# Patient Record
Sex: Female | Born: 1985
Health system: Southern US, Community
[De-identification: ages and names within clinical notes are randomized; demographics above are authoritative.]

## PROBLEM LIST (undated history)

## (undated) HISTORY — DX: Morbid (severe) obesity due to excess calories: E66.01

---

## 2007-03-09 ENCOUNTER — Emergency Department: Payer: Self-pay | Admitting: Emergency Medicine

## 2008-11-14 ENCOUNTER — Emergency Department: Payer: Self-pay | Admitting: Emergency Medicine

## 2011-01-14 ENCOUNTER — Emergency Department: Payer: Self-pay | Admitting: Internal Medicine

## 2011-04-06 ENCOUNTER — Emergency Department: Payer: Self-pay | Admitting: Unknown Physician Specialty

## 2011-08-24 ENCOUNTER — Emergency Department: Payer: Self-pay | Admitting: Emergency Medicine

## 2020-06-18 ENCOUNTER — Ambulatory Visit
Admission: EM | Admit: 2020-06-18 | Discharge: 2020-06-18 | Disposition: A | Discharge status: Home / Self Care | Payer: BC Managed Care – PPO | Attending: Physician Assistant | Admitting: Physician Assistant

## 2020-06-18 ENCOUNTER — Encounter: Payer: Self-pay | Admitting: Emergency Medicine

## 2020-06-18 ENCOUNTER — Ambulatory Visit (INDEPENDENT_AMBULATORY_CARE_PROVIDER_SITE_OTHER): Payer: BC Managed Care – PPO

## 2020-06-18 ENCOUNTER — Other Ambulatory Visit: Payer: Self-pay

## 2020-06-18 DIAGNOSIS — R0602 Shortness of breath: Secondary | ICD-10-CM | POA: Diagnosis not present

## 2020-06-18 DIAGNOSIS — R059 Cough, unspecified: Secondary | ICD-10-CM

## 2020-06-18 DIAGNOSIS — U071 COVID-19: Secondary | ICD-10-CM | POA: Diagnosis not present

## 2020-06-18 DIAGNOSIS — R5383 Other fatigue: Secondary | ICD-10-CM | POA: Diagnosis not present

## 2020-06-18 DIAGNOSIS — R509 Fever, unspecified: Secondary | ICD-10-CM

## 2020-06-18 LAB — RESP PANEL BY RT-PCR (FLU A&B, COVID) ARPGX2
Influenza A by PCR: NEGATIVE
Influenza B by PCR: NEGATIVE
SARS Coronavirus 2 by RT PCR: POSITIVE — AB

## 2020-06-18 MED ORDER — ALBUTEROL SULFATE HFA 108 (90 BASE) MCG/ACT IN AERS
1.0000 | INHALATION_SPRAY | Freq: Four times a day (QID) | RESPIRATORY_TRACT | 0 refills | Status: AC | PRN
Start: 2020-06-18 — End: 2020-06-25

## 2020-06-18 MED ORDER — PSEUDOEPH-BROMPHEN-DM 30-2-10 MG/5ML PO SYRP: 10.0000 mL | ORAL_SOLUTION | Freq: Four times a day (QID) | ORAL | 0 refills | Status: AC | PRN

## 2020-06-18 MED ORDER — IBUPROFEN 800 MG PO TABS
800.0000 mg | ORAL_TABLET | Freq: Once | ORAL | Status: AC
  Administered 2020-06-18: 800 mg via ORAL

## 2020-06-18 MED ORDER — IBUPROFEN 800 MG PO TABS: 800.0000 mg | ORAL_TABLET | Freq: Three times a day (TID) | ORAL | 0 refills | Status: AC

## 2020-06-18 NOTE — Discharge Instructions (Addendum)
Your Covid test is positive.  You need to be isolated at least another 4 days.  If symptomatic, go home and rest. Push fluids. Take Tylenol as needed for discomfort. Gargle warm salt water. Throat lozenges. Take Mucinex DM or Robitussin for cough. Humidifier in bedroom to ease coughing. Warm showers. Also review the COVID handout for more information.  COVID-19 INFECTION: The incubation period of COVID-19 is approximately 14 days after exposure, with most symptoms developing in roughly 4-5 days. Symptoms may range in severity from mild to critically severe. Roughly 80% of those infected will have mild symptoms. People of any age may become infected with COVID-19 and have the ability to transmit the virus. The most common symptoms include: fever, fatigue, cough, body aches, headaches, sore throat, nasal congestion, shortness of breath, nausea, vomiting, diarrhea, changes in smell and/or taste.    COURSE OF ILLNESS Some patients may begin with mild disease which can progress quickly into critical symptoms. If your symptoms are worsening please call ahead to the Emergency Department and proceed there for further treatment. Recovery time appears to be roughly 1-2 weeks for mild symptoms and 3-6 weeks for severe disease.   GO IMMEDIATELY TO ER FOR FEVER YOU ARE UNABLE TO GET DOWN WITH TYLENOL, BREATHING PROBLEMS, CHEST PAIN, FATIGUE, LETHARGY, INABILITY TO EAT OR DRINK, ETC  QUARANTINE AND ISOLATION: To help decrease the spread of COVID-19 please remain isolated if you have COVID infection or are highly suspected to have COVID infection. This means -stay home and isolate to one room in the home if you live with others. Do not share a bed or bathroom with others while ill, sanitize and wipe down all countertops and keep common areas clean and disinfected. You may discontinue isolation if you have a mild case and are asymptomatic 10 days after symptom onset as long as you have been fever free >24 hours without  having to take Motrin or Tylenol. If your case is more severe (meaning you develop pneumonia or are admitted in the hospital), you may have to isolate longer.   If you have been in close contact (within 6 feet) of someone diagnosed with COVID 19, you are advised to quarantine in your home for 14 days as symptoms can develop anywhere from 2-14 days after exposure to the virus. If you develop symptoms, you  must isolate.  Most current guidelines for COVID after exposure -isolate 10 days if you ARE NOT tested for COVID as long as symptoms do not develop -isolate 7 days if you are tested and remain asymptomatic -You do not necessarily need to be tested for COVID if you have + exposure and        develop   symptoms. Just isolate at home x10 days from symptom onset During this global pandemic, CDC advises to practice social distancing, try to stay at least 54ft away from others at all times. Wear a face covering. Wash and sanitize your hands regularly and avoid going anywhere that is not necessary.  KEEP IN MIND THAT THE COVID TEST IS NOT 100% ACCURATE AND YOU SHOULD STILL DO EVERYTHING TO PREVENT POTENTIAL SPREAD OF VIRUS TO OTHERS (WEAR MASK, WEAR GLOVES, WASH HANDS AND SANITIZE REGULARLY). IF INITIAL TEST IS NEGATIVE, THIS MAY NOT MEAN YOU ARE DEFINITELY NEGATIVE. MOST ACCURATE TESTING IS DONE 5-7 DAYS AFTER EXPOSURE.   It is not advised by CDC to get re-tested after receiving a positive COVID test since you can still test positive for weeks to months after you  have already cleared the virus.   *If you have not been vaccinated for COVID, I strongly suggest you consider getting vaccinated as long as there are no contraindications.

## 2020-06-18 NOTE — ED Triage Notes (Signed)
Pt c/o cough, shortness of breath and fatigue. Started about 6 days ago. She has not had covid vaccines.

## 2020-06-18 NOTE — ED Provider Notes (Signed)
[facility]    CSN: [id number] Arrival date & time: 06/18/20  1635      History   Chief Complaint Chief Complaint  Patient presents with  . Cough    HPI Laura Schroeder is a 34 y.o. female presenting for 6-day history of cough, shortness of breath, and fatigue.  She says she has felt feverish but has not recorded temperature.  She says that her cough is productive of yellow mucus.  Patient admits to feeling short of breath when she walks or if she talks too much.  She also admits to body aches.  She denies any chest pain, abdominal pain, nausea/vomiting or diarrhea.  Patient denies any known COVID-19 or influenza exposure, but says that her boyfriend has been sick with a cough and fever.  She has not had COVID-19 vaccine.  Patient denies any history of cardiopulmonary disease.  She says she has taken over-the-counter Alka-Seltzer for symptoms.  Patient denies any chronic medical problems and does not take any routine medications.  Patient denies any other complaints or concerns.  HPI  History reviewed. No pertinent past medical history.  There are no problems to display for this patient.   History reviewed. No pertinent surgical history.  OB History   No obstetric history on file.      Home Medications    Prior to Admission medications   Medication Sig Start Date End Date Taking? Authorizing Provider  albuterol (VENTOLIN HFA) 108 (90 Base) MCG/ACT inhaler Inhale 1-2 puffs into the lungs every 6 (six) hours as needed for up to 7 days for wheezing or shortness of breath. 06/18/20 06/25/20  Eusebio Friendly B, PA-C  brompheniramine-pseudoephedrine-DM 30-2-10 MG/5ML syrup Take 10 mLs by mouth 4 (four) times daily as needed for up to 7 days. 06/18/20 06/25/20  Eusebio Friendly B, PA-C  ibuprofen (ADVIL) 800 MG tablet Take 1 tablet (800 mg total) by mouth 3 (three) times daily for 7 days. 06/18/20 06/25/20  Shirlee Latch, PA-C    Family History No family history on file.   Social History Social History   Tobacco Use  . Smoking status: Never Smoker  . Smokeless tobacco: Never Used  Vaping Use  . Vaping Use: Never used  Substance Use Topics  . Alcohol use: Not Currently  . Drug use: Not Currently     Allergies   Patient has no known allergies.   Review of Systems Review of Systems  Constitutional: Positive for fatigue. Negative for chills, diaphoresis and fever.  HENT: Positive for congestion, rhinorrhea and sore throat. Negative for ear pain, sinus pressure and sinus pain.   Respiratory: Positive for cough and shortness of breath.   Gastrointestinal: Negative for abdominal pain, nausea and vomiting.  Musculoskeletal: Negative for arthralgias and myalgias.  Skin: Negative for rash.  Neurological: Negative for weakness and headaches.  Hematological: Negative for adenopathy.     Physical Exam Triage Vital Signs ED Triage Vitals  Enc Vitals Group     BP 06/18/20 1715 120/76     Pulse Rate 06/18/20 1715 (!) 124     Resp 06/18/20 1715 20     Temp 06/18/20 1715 100.1 F (37.8 C)     Temp Source 06/18/20 1715 Oral     SpO2 06/18/20 1715 98 %     Weight 06/18/20 1712 235 lb (106.6 kg)     Height 06/18/20 1712 5\' 8"  (1.727 m)     Head Circumference --      Peak Flow --  Pain Score 06/18/20 1712 0     Pain Loc --      Pain Edu? --      Excl. in GC? --    No data found.  Updated Vital Signs BP 120/76 (BP Location: Right Arm)   Pulse (!) 124   Temp 100.1 F (37.8 C) (Oral)   Resp 20   Ht 5\' 8"  (1.727 m)   Wt 235 lb (106.6 kg)   LMP 06/14/2020   SpO2 98%   BMI 35.73 kg/m       Physical Exam Vitals and nursing note reviewed.  Constitutional:      General: She is not in acute distress.    Appearance: Normal appearance. She is ill-appearing. She is not toxic-appearing.  HENT:     Head: Normocephalic and atraumatic.     Nose: Congestion and rhinorrhea present.     Mouth/Throat:     Mouth: Mucous membranes are moist.      Pharynx: Oropharynx is clear.  Eyes:     General: No scleral icterus.       Right eye: No discharge.        Left eye: No discharge.     Conjunctiva/sclera: Conjunctivae normal.  Cardiovascular:     Rate and Rhythm: Regular rhythm. Tachycardia present.     Heart sounds: Normal heart sounds.  Pulmonary:     Effort: Pulmonary effort is normal. No respiratory distress.     Breath sounds: Normal breath sounds. No wheezing, rhonchi or rales.     Comments: Patient seems to have difficulty taking a deep breath. Musculoskeletal:     Cervical back: Neck supple.  Skin:    General: Skin is dry.  Neurological:     General: No focal deficit present.     Mental Status: She is alert. Mental status is at baseline.     Motor: No weakness.     Gait: Gait normal.  Psychiatric:        Mood and Affect: Mood normal.        Behavior: Behavior normal.        Thought Content: Thought content normal.      UC Treatments / Results  Labs (all labs ordered are listed, but only abnormal results are displayed) Labs Reviewed  RESP PANEL BY RT-PCR (FLU A&B, COVID) ARPGX2 - Abnormal; Notable for the following components:      Result Value   SARS Coronavirus 2 by RT PCR POSITIVE (*)    All other components within normal limits    EKG   Radiology DG Chest 2 View  Result Date: 06/18/2020 CLINICAL DATA:  Shortness of breath with cough and fever. Symptoms for 6 days. EXAM: CHEST - 2 VIEW COMPARISON:  None. FINDINGS: There are low lung volumes with bibasilar pulmonary opacities which are favored to reflect atelectasis. There is no pleural effusion or pneumothorax. The heart size and mediastinal contours are normal. The bones appear unremarkable. IMPRESSION: Low lung volumes with probable bibasilar atelectasis. Given the patient's symptoms, early pneumonia cannot be excluded. Electronically Signed   By: 14/12/2019 M.D.   On: 06/18/2020 18:19    Procedures Procedures (including critical care time)   Medications Ordered in UC Medications  ibuprofen (ADVIL) tablet 800 mg (800 mg Oral Given 06/18/20 1739)    Initial Impression / Assessment and Plan / UC Course  I have reviewed the triage vital signs and the nursing notes.  Pertinent labs & imaging results that were available during my care of  the patient were reviewed by me and considered in my medical decision making (see chart for details).   34 year old female presenting for 6-day history of cough, congestion, fatigue and body aches.  In clinic temperature is 100.1 F and pulse is 124 bpm.  Other vital signs are all stable.  She is ill-appearing but nontoxic.  She does have nasal congestion and rhinorrhea on exam.  Chest is clear to auscultation, but she appears to have difficulty taking deep breath.  Heart regular rhythm.  Respiratory panel obtained today to assess for possible flu and Covid.  Patient given 800 mg ibuprofen for body aches.  Chest x-ray ordered at this time for cough, fever, tachycardia and complaint of shortness of breath.  Chest x-ray obtained to assess for possible pneumonia.  Chest x-ray shows atelectasis and low lung volumes but no definitive pneumonia.  I have independently reviewed images. Discussed results with patient.  She did test positive for COVID-19 on the respiratory swab.  Vital signs are stable so advised supportive care at home.  I sent Bromfed cough syrup, ibuprofen, and an inhaler.  CDC guidelines, isolation protocol and ED precautions reviewed with patient.  Contacted infusion center and asked them to give patient a call if she meets criteria for MAB therapy.  Final Clinical Impressions(s) / UC Diagnoses   Final diagnoses:  COVID-19  Cough  Fatigue, unspecified type  Shortness of breath     Discharge Instructions     Your Covid test is positive.  You need to be isolated at least another 4 days.  If symptomatic, go home and rest. Push fluids. Take Tylenol as needed for discomfort. Gargle  warm salt water. Throat lozenges. Take Mucinex DM or Robitussin for cough. Humidifier in bedroom to ease coughing. Warm showers. Also review the COVID handout for more information.  COVID-19 INFECTION: The incubation period of COVID-19 is approximately 14 days after exposure, with most symptoms developing in roughly 4-5 days. Symptoms may range in severity from mild to critically severe. Roughly 80% of those infected will have mild symptoms. People of any age may become infected with COVID-19 and have the ability to transmit the virus. The most common symptoms include: fever, fatigue, cough, body aches, headaches, sore throat, nasal congestion, shortness of breath, nausea, vomiting, diarrhea, changes in smell and/or taste.    COURSE OF ILLNESS Some patients may begin with mild disease which can progress quickly into critical symptoms. If your symptoms are worsening please call ahead to the Emergency Department and proceed there for further treatment. Recovery time appears to be roughly 1-2 weeks for mild symptoms and 3-6 weeks for severe disease.   GO IMMEDIATELY TO ER FOR FEVER YOU ARE UNABLE TO GET DOWN WITH TYLENOL, BREATHING PROBLEMS, CHEST PAIN, FATIGUE, LETHARGY, INABILITY TO EAT OR DRINK, ETC  QUARANTINE AND ISOLATION: To help decrease the spread of COVID-19 please remain isolated if you have COVID infection or are highly suspected to have COVID infection. This means -stay home and isolate to one room in the home if you live with others. Do not share a bed or bathroom with others while ill, sanitize and wipe down all countertops and keep common areas clean and disinfected. You may discontinue isolation if you have a mild case and are asymptomatic 10 days after symptom onset as long as you have been fever free >24 hours without having to take Motrin or Tylenol. If your case is more severe (meaning you develop pneumonia or are admitted in the hospital), you may  have to isolate longer.   If you have  been in close contact (within 6 feet) of someone diagnosed with COVID 19, you are advised to quarantine in your home for 14 days as symptoms can develop anywhere from 2-14 days after exposure to the virus. If you develop symptoms, you  must isolate.  Most current guidelines for COVID after exposure -isolate 10 days if you ARE NOT tested for COVID as long as symptoms do not develop -isolate 7 days if you are tested and remain asymptomatic -You do not necessarily need to be tested for COVID if you have + exposure and        develop   symptoms. Just isolate at home x10 days from symptom onset During this global pandemic, CDC advises to practice social distancing, try to stay at least 606ft away from others at all times. Wear a face covering. Wash and sanitize your hands regularly and avoid going anywhere that is not necessary.  KEEP IN MIND THAT THE COVID TEST IS NOT 100% ACCURATE AND YOU SHOULD STILL DO EVERYTHING TO PREVENT POTENTIAL SPREAD OF VIRUS TO OTHERS (WEAR MASK, WEAR GLOVES, WASH HANDS AND SANITIZE REGULARLY). IF INITIAL TEST IS NEGATIVE, THIS MAY NOT MEAN YOU ARE DEFINITELY NEGATIVE. MOST ACCURATE TESTING IS DONE 5-7 DAYS AFTER EXPOSURE.   It is not advised by CDC to get re-tested after receiving a positive COVID test since you can still test positive for weeks to months after you have already cleared the virus.   *If you have not been vaccinated for COVID, I strongly suggest you consider getting vaccinated as long as there are no contraindications.      ED Prescriptions    Medication Sig Dispense Auth. Provider   brompheniramine-pseudoephedrine-DM 30-2-10 MG/5ML syrup Take 10 mLs by mouth 4 (four) times daily as needed for up to 7 days. 150 mL Eusebio FriendlyEaves, Mackensie Pilson B, PA-C   ibuprofen (ADVIL) 800 MG tablet Take 1 tablet (800 mg total) by mouth 3 (three) times daily for 7 days. 21 tablet Eusebio FriendlyEaves, Dilan Novosad B, PA-C   albuterol (VENTOLIN HFA) 108 (90 Base) MCG/ACT inhaler Inhale 1-2 puffs into the lungs  every 6 (six) hours as needed for up to 7 days for wheezing or shortness of breath. 1 g Shirlee LatchEaves, Meghana Tullo B, PA-C     PDMP not reviewed this encounter.   Shirlee Latchaves, Yitzel Shasteen B, PA-C 06/18/20 1830

## 2020-06-19 ENCOUNTER — Telehealth: Payer: Self-pay | Admitting: Physician Assistant

## 2020-06-19 ENCOUNTER — Encounter: Payer: Self-pay | Admitting: Physician Assistant

## 2020-06-19 NOTE — Telephone Encounter (Signed)
Called to discuss with patient about Covid symptoms and the use of sotrovimab, bamlanivimab/etesevimab or casirivimab/imdevimab, a monoclonal antibody infusion for those with mild to moderate Covid symptoms and at a high risk of hospitalization.  Pt is qualified for this infusion at the [facility] due to; Specific high risk criteria : BMI > 25 and Other high risk medical condition per CDC:  high SVI  Sx onset 11/30 based on UC notes.   Message left to call back our hotline [phone]. My chart message sent if active on Mychart.   Cline Crock PA-C

## 2020-07-09 ENCOUNTER — Other Ambulatory Visit: Payer: Self-pay

## 2020-07-09 ENCOUNTER — Encounter: Payer: Self-pay | Admitting: Physician Assistant

## 2020-07-09 DIAGNOSIS — Z20822 Contact with and (suspected) exposure to covid-19: Secondary | ICD-10-CM

## 2020-07-11 LAB — NOVEL CORONAVIRUS, NAA: SARS-CoV-2, NAA: NOT DETECTED

## 2020-07-11 LAB — SARS-COV-2, NAA 2 DAY TAT

## 2021-12-23 IMAGING — CR DG CHEST 2V
2 series · 3 of 3 positions shown · non-contrast
Comparison: None.

CLINICAL DATA: Shortness of breath with cough and fever. Symptoms
for 6 days.

EXAM:
CHEST - 2 VIEW

[Series 1: chest pa · 0.14mm/px · 2 of 2 slices shown]
[im 1/2]
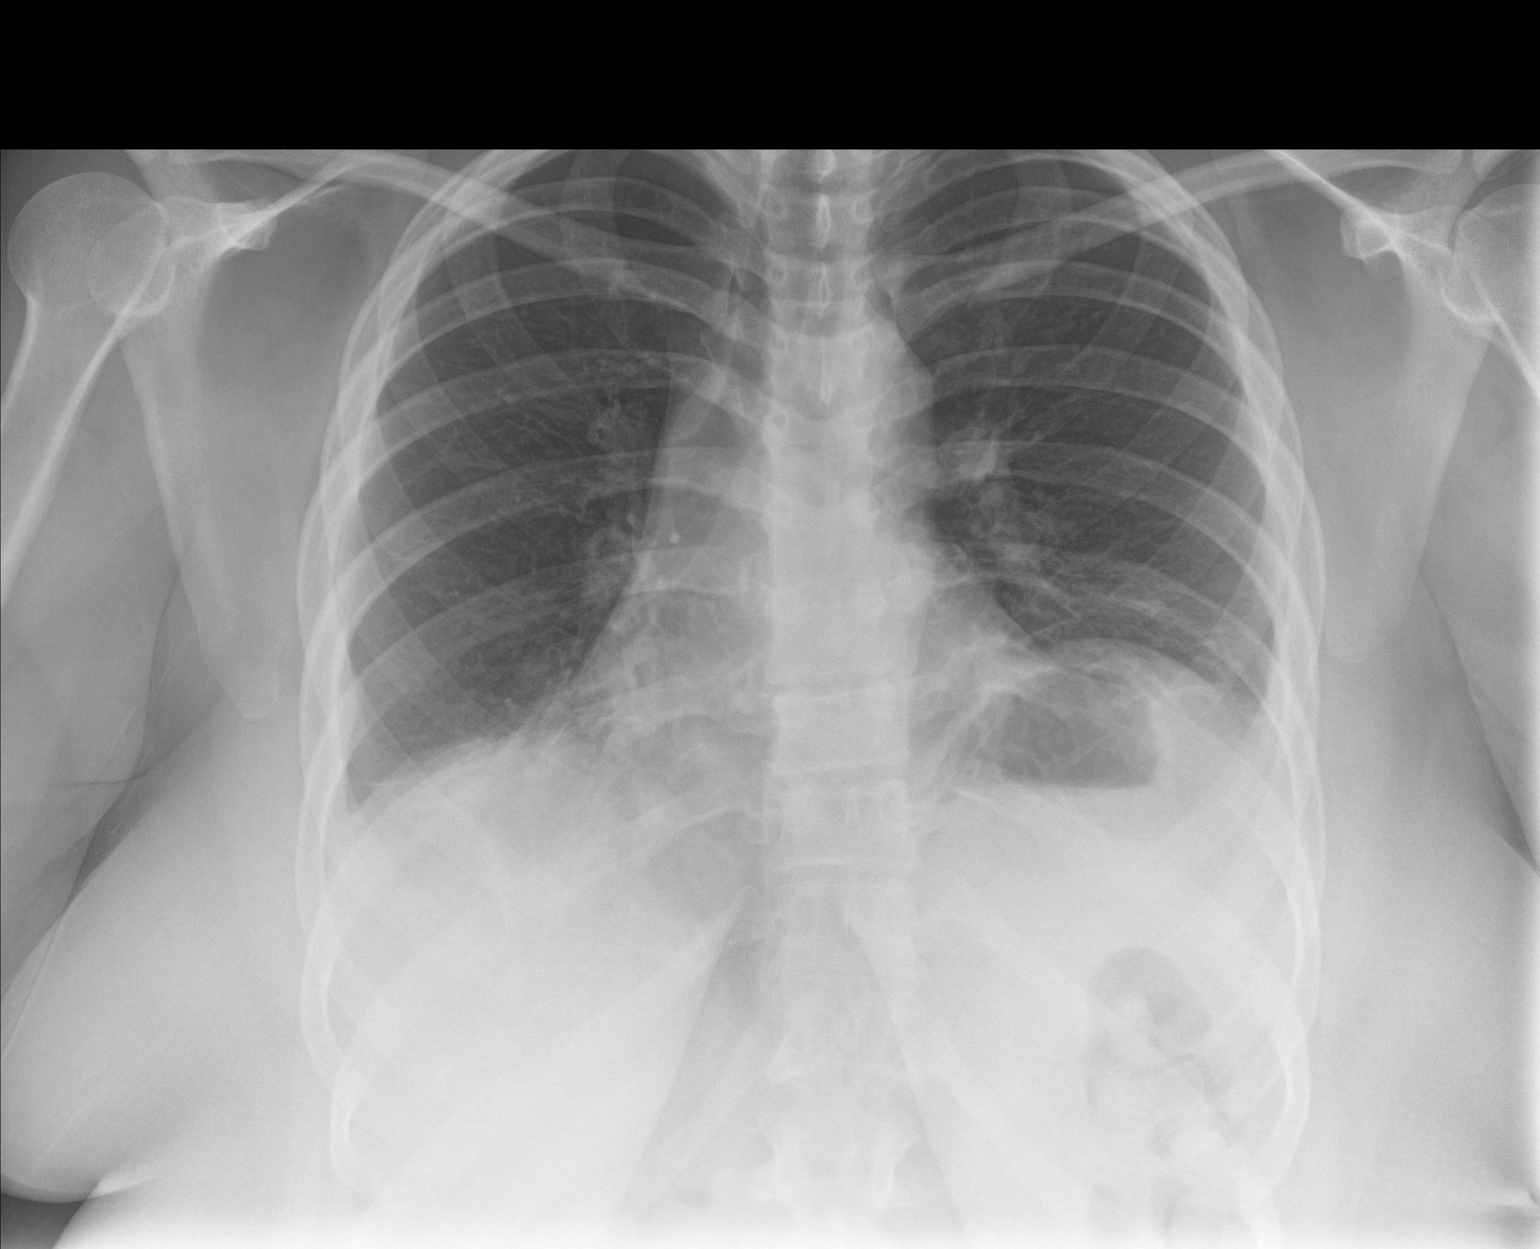
[im 2/2]
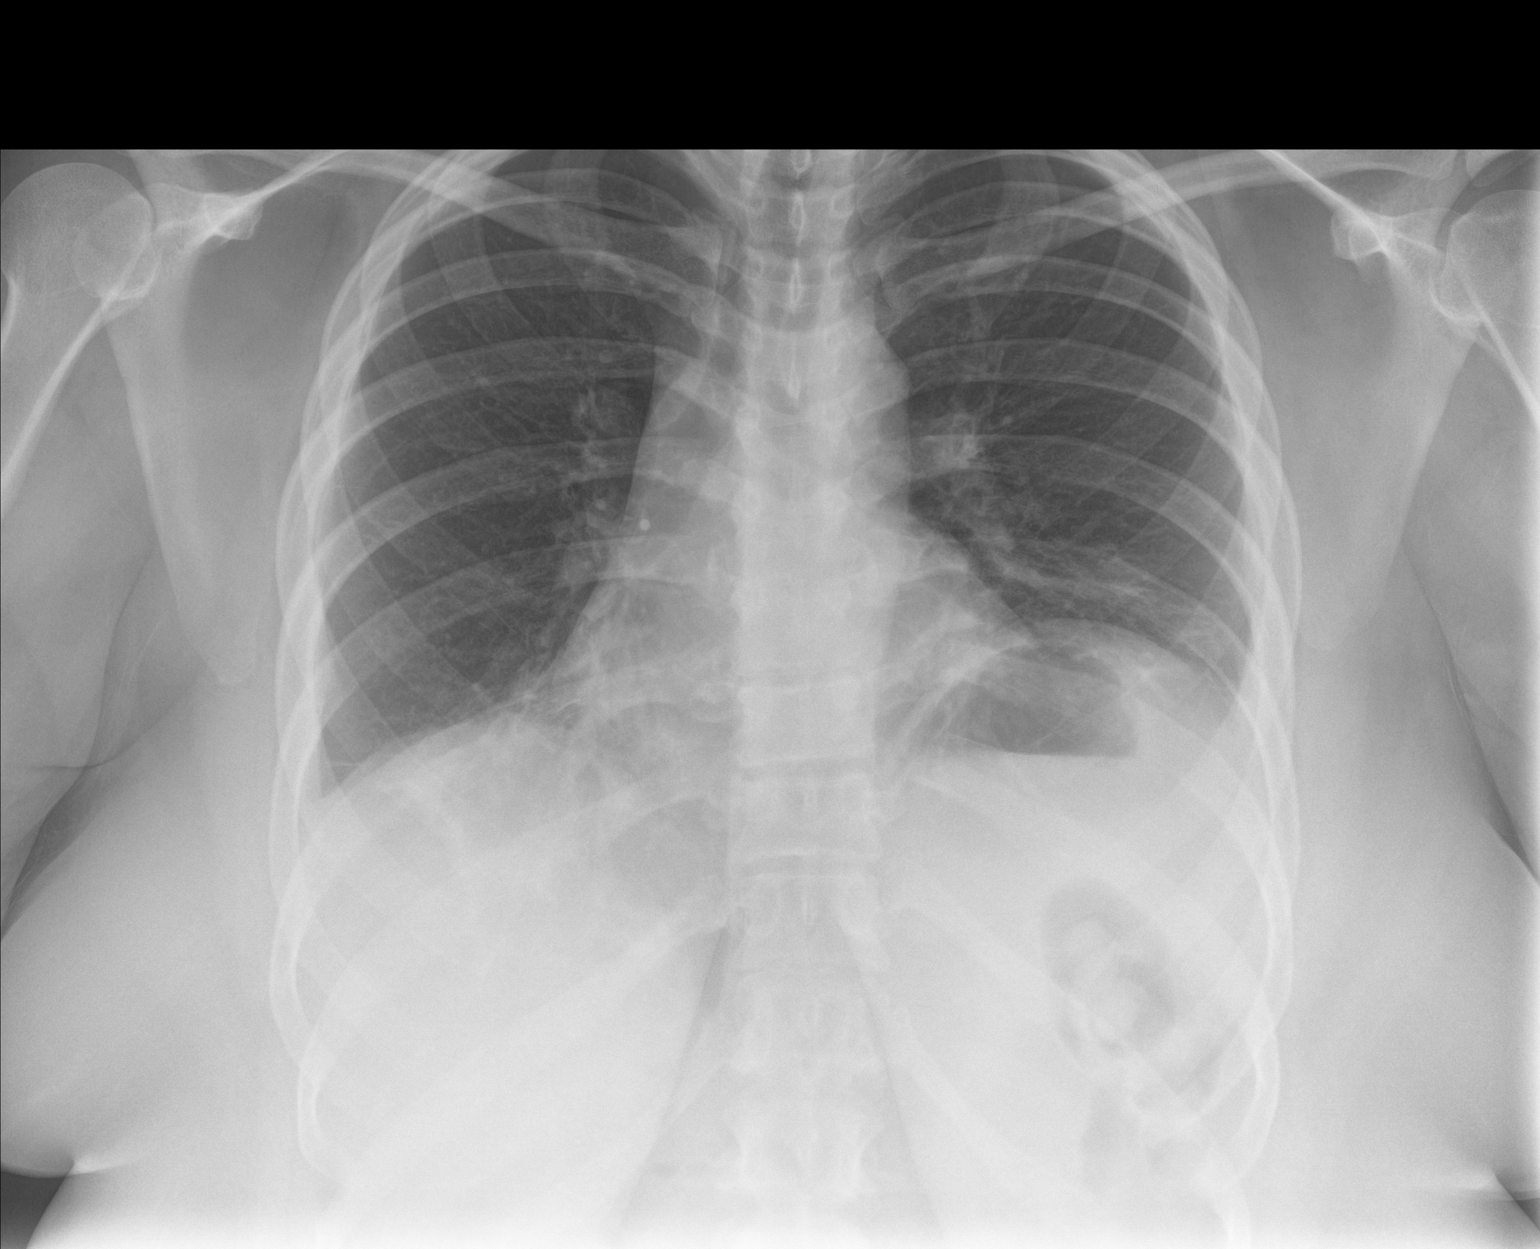

[chest lat]
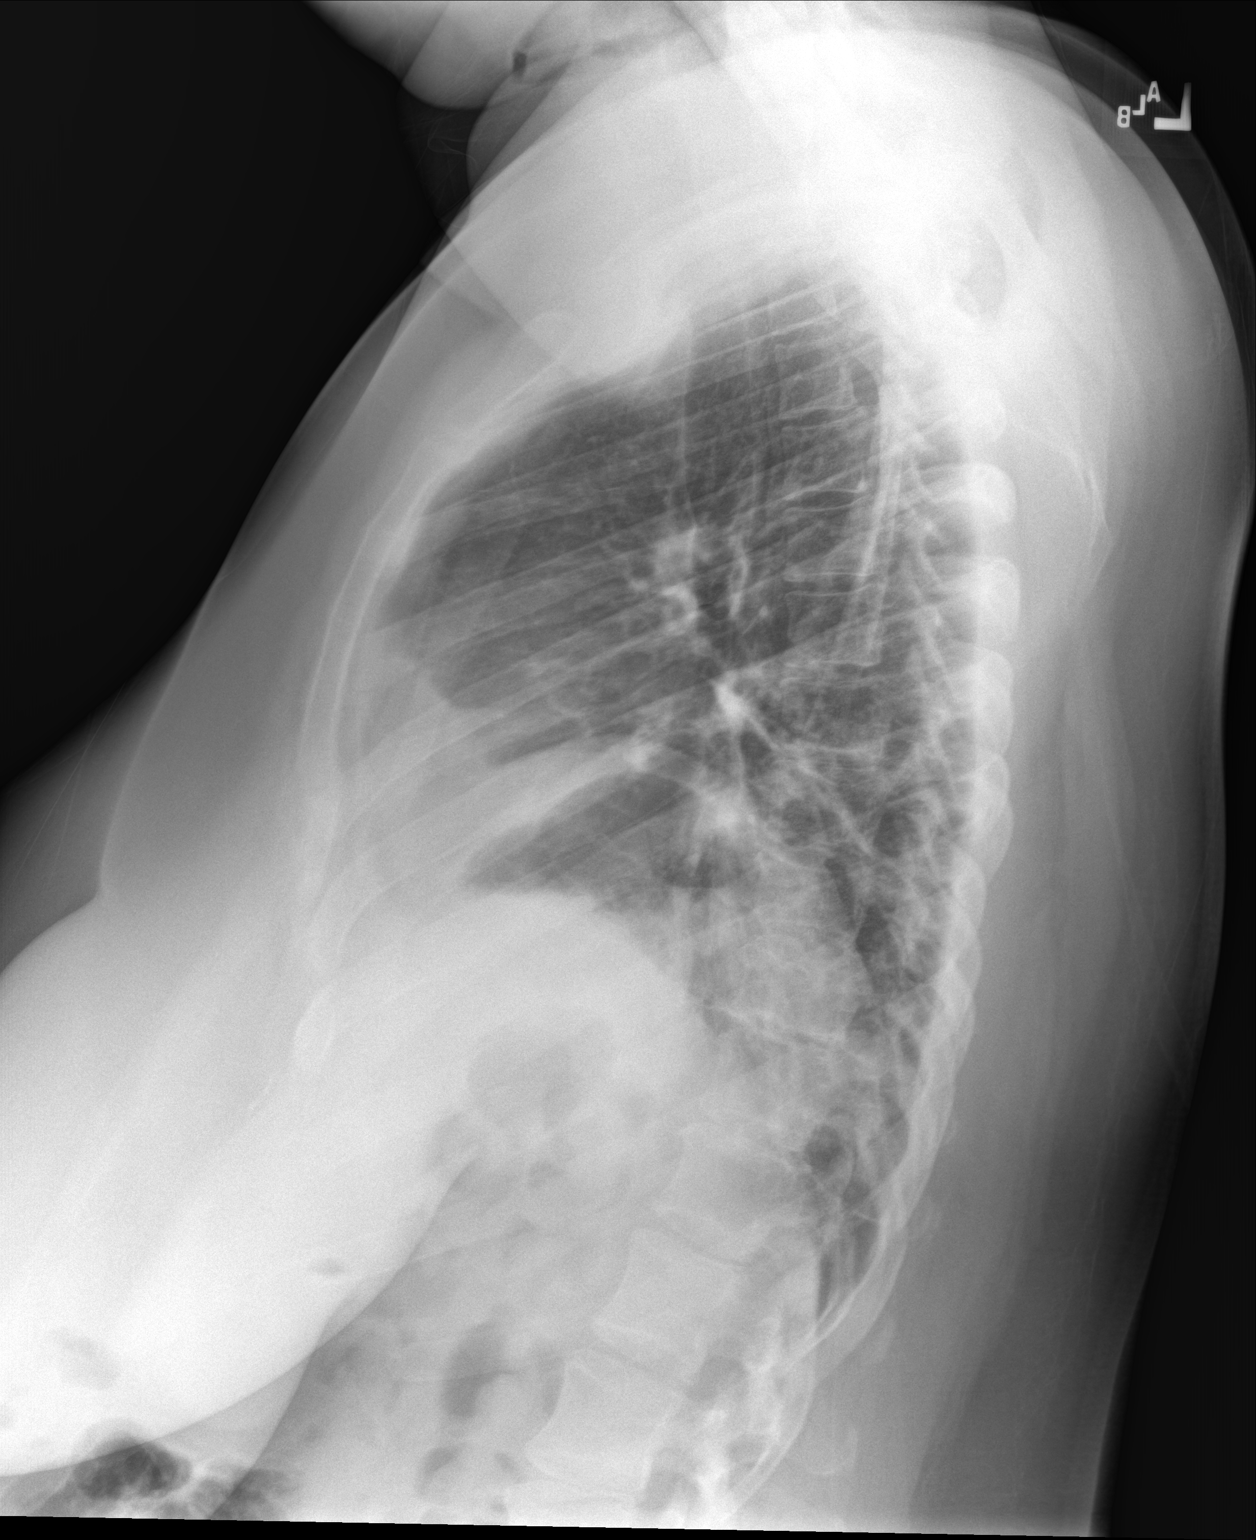

[3 of 3 positions shown; findings below may reference images not displayed]

FINDINGS: There are low lung volumes with bibasilar pulmonary opacities which
are favored to reflect atelectasis. There is no pleural effusion or
pneumothorax. The heart size and mediastinal contours are normal.
The bones appear unremarkable.
IMPRESSION: Low lung volumes with probable bibasilar atelectasis. Given the
patient's symptoms, early pneumonia cannot be excluded.

## 2022-01-10 ENCOUNTER — Other Ambulatory Visit: Payer: Self-pay

## 2022-01-10 ENCOUNTER — Encounter: Payer: Self-pay | Admitting: Emergency Medicine

## 2022-01-10 ENCOUNTER — Emergency Department
Admission: EM | Admit: 2022-01-10 | Discharge: 2022-01-10 | Disposition: A | Discharge status: Home / Self Care | Payer: BC Managed Care – PPO | Attending: Emergency Medicine | Admitting: Emergency Medicine

## 2022-01-10 DIAGNOSIS — Z202 Contact with and (suspected) exposure to infections with a predominantly sexual mode of transmission: Secondary | ICD-10-CM | POA: Diagnosis present

## 2022-01-10 LAB — WET PREP, GENITAL
Clue Cells Wet Prep HPF POC: NONE SEEN
Sperm: NONE SEEN
Trich, Wet Prep: NONE SEEN
WBC, Wet Prep HPF POC: <10 (ref <10)
Yeast Wet Prep HPF POC: NONE SEEN

## 2022-01-10 LAB — URINALYSIS, COMPLETE (UACMP) WITH MICROSCOPIC
Bilirubin Urine: NEGATIVE
Glucose, UA: NEGATIVE mg/dL
Hgb urine dipstick: NEGATIVE
Ketones, ur: NEGATIVE mg/dL
Leukocytes,Ua: NEGATIVE
Nitrite: NEGATIVE
Protein, ur: NEGATIVE mg/dL
Specific Gravity, Urine: 1.021 (ref 1.005–1.030)
pH: 5 (ref 5.0–8.0)

## 2022-01-10 LAB — POC URINE PREG, ED: Preg Test, Ur: NEGATIVE

## 2022-01-10 LAB — CHLAMYDIA/NGC RT PCR (ARMC ONLY)
Chlamydia Tr: NOT DETECTED
N gonorrhoeae: NOT DETECTED

## 2022-01-10 NOTE — ED Triage Notes (Signed)
Pt to ED via POV, states had protected sex with a new partner several days ago, pt states first time having intercourse in approx 6 yrs. Pt states today vaginal irritation and red mucous. Pt c/o burning to vaginal area at this time. Pt A&O x4, NAD noted at this time.

## 2022-01-10 NOTE — ED Provider Notes (Signed)
[facility] Provider Note    None    (approximate)   History   Exposure to STD   HPI  Laura Schroeder is a 36 y.o. female who presents today for evaluation of vaginal irritation after she was sexually active for the first time in 6 years a couple of days ago.  She reports that she had consensual vaginal intercourse with a known partner and they used a condom.  Patient reports that her symptoms began this morning.  She has not noticed any discharge.  She reports that the day after she had intercourse she started to have mild blood-tinged discharge which has since resolved.  She denies burning with urination.  She has not had any fevers or chills.  She denies flank pain or belly pain.  Patient Active Problem List   Diagnosis Date Noted   Morbid obesity [facility])           Physical Exam   Triage Vital Signs: ED Triage Vitals  Enc Vitals Group     BP 01/10/22 0126 120/77     Pulse Rate 01/10/22 0126 94     Resp 01/10/22 0126 20     Temp 01/10/22 0126 98.1 F (36.7 C)     Temp Source 01/10/22 0126 Oral     SpO2 01/10/22 0126 100 %     Weight 01/10/22 0126 238 lb (108 kg)     Height 01/10/22 0126 5\' 8"  (1.727 m)     Head Circumference --      Peak Flow --      Pain Score 01/10/22 0125 7     Pain Loc --      Pain Edu? --      Excl. in GC? --     Most recent vital signs: Vitals:   01/10/22 0126 01/10/22 0723  BP: 120/77 122/70  Pulse: 94 88  Resp: 20 20  Temp: 98.1 F (36.7 C)   SpO2: 100% 100%    Physical Exam Vitals and nursing note reviewed.  Constitutional:      General: Awake and alert. No acute distress.    Appearance: Normal appearance. The patient is obese.  HENT:     Head: Normocephalic and atraumatic.     Mouth: Mucous membranes are moist.  Eyes:     General: PERRL. Normal EOMs        Right eye: No discharge.        Left eye: No discharge.     Conjunctiva/sclera: Conjunctivae normal.  Cardiovascular:     Rate and Rhythm:  Normal rate and regular rhythm.     Pulses: Normal pulses.     Heart sounds: Normal heart sounds Pulmonary:     Effort: Pulmonary effort is normal. No respiratory distress.     Breath sounds: Normal breath sounds.  Abdominal:     Abdomen is soft. There is no abdominal tenderness. No rebound or guarding. No distention. GU: Normal external genitalia.  Normal-appearing cervix.  No discharge in the vaginal vault.  No bleeding.  No cervical motion tenderness.  No adnexal fullness or tenderness on bimanual exam Musculoskeletal:        General: No swelling. Normal range of motion.     Cervical back: Normal range of motion and neck supple.  Skin:    General: Skin is warm and dry.     Capillary Refill: Capillary refill takes less than 2 seconds.     Findings: No rash.  Neurological:  Mental Status: The patient is awake and alert.      ED Results / Procedures / Treatments   Labs (all labs ordered are listed, but only abnormal results are displayed) Labs Reviewed  URINALYSIS, COMPLETE (UACMP) WITH MICROSCOPIC - Abnormal; Notable for the following components:      Result Value   Color, Urine YELLOW (*)    APPearance CLEAR (*)    Bacteria, UA RARE (*)    All other components within normal limits  CHLAMYDIA/NGC RT PCR ([facility] ONLY)            WET PREP, GENITAL  POC URINE PREG, ED     EKG     RADIOLOGY     PROCEDURES:  Critical Care performed:   Procedures   MEDICATIONS ORDERED IN ED: Medications - No data to display   IMPRESSION / MDM / ASSESSMENT AND PLAN / ED COURSE  I reviewed the triage vital signs and the nursing notes.   Differential diagnosis includes, but is not limited to, sexually transmitted disease, bacterial vaginosis, yeast infection, mucosal irritation, pregnancy.  Patient presents emergency department awake and alert, hemodynamically stable and afebrile.  Pelvic exam is normal without cervical motion tenderness, discharge, bleeding, or adnexal  fullness or tenderness.  She has no abdominal tenderness on exam.  No fever or constitutional symptoms.  Low suspicion for TOA.  Urinalysis does not reveal evidence of UTI.  Gonorrhea and Chlamydia are negative.  Wet prep is also negative.  Urine pregnancy is negative.  Offered HIV and syphilis testing however patient declined.  Patient advised that there are many other sexually transmitted diseases that exist and she was advised to be tested fully before becoming sexually active again.  Also advised that if any sexually transmitted disease is found, then she should have her partner tested and treated as well.  We discussed return precautions and outpatient follow-up.  Patient understands and agrees with plan.  Discharged in stable condition.   Patient's presentation is most consistent with acute complicated illness / injury requiring diagnostic workup.       FINAL CLINICAL IMPRESSION(S) / ED DIAGNOSES   Final diagnoses:  Possible exposure to STD     Rx / DC Orders   ED Discharge Orders     None        Note:  This document was prepared using Dragon voice recognition software and may include unintentional dictation errors.   Jackelyn Hoehn, PA-C 01/10/22 1017    Merwyn Katos, MD 01/10/22 [id number]

## 2022-01-10 NOTE — Discharge Instructions (Signed)
Your gonorrhea, chlamydia, bacterial vaginosis, yeast, and trichomonas tests were normal.  Continue to practice safe intercourse.  As we discussed, there are other STDs that exist, and you may follow-up with your outpatient provider for the full panel testing and treatment including HIV and syphilis as we discussed today.  Please return for any new, worsening, or change in symptoms or other concerns.  It was a pleasure caring for you today.

## 2022-09-03 ENCOUNTER — Other Ambulatory Visit: Payer: Self-pay | Admitting: Family

## 2022-09-03 DIAGNOSIS — R102 Pelvic and perineal pain: Secondary | ICD-10-CM

## 2022-09-03 DIAGNOSIS — R1032 Left lower quadrant pain: Secondary | ICD-10-CM

## 2022-09-10 ENCOUNTER — Ambulatory Visit: Admission: RE | Admit: 2022-09-10 | Payer: BC Managed Care – PPO | Source: Ambulatory Visit
